# Patient Record
Sex: Male | Born: 1975 | Race: Black or African American | Hispanic: No | Marital: Married | Smoking: Current every day smoker
Health system: Southern US, Community
[De-identification: ages and names within clinical notes are randomized; demographics above are authoritative.]

## PROBLEM LIST (undated history)

## (undated) DIAGNOSIS — S32009A Unspecified fracture of unspecified lumbar vertebra, initial encounter for closed fracture: Secondary | ICD-10-CM

## (undated) DIAGNOSIS — M719 Bursopathy, unspecified: Secondary | ICD-10-CM

## (undated) DIAGNOSIS — I10 Essential (primary) hypertension: Secondary | ICD-10-CM

## (undated) HISTORY — PX: KNEE SURGERY: SHX244

---

## 2012-11-01 ENCOUNTER — Encounter (HOSPITAL_BASED_OUTPATIENT_CLINIC_OR_DEPARTMENT_OTHER): Payer: Self-pay

## 2012-11-01 ENCOUNTER — Emergency Department (HOSPITAL_BASED_OUTPATIENT_CLINIC_OR_DEPARTMENT_OTHER): Payer: 59

## 2012-11-01 ENCOUNTER — Emergency Department (HOSPITAL_BASED_OUTPATIENT_CLINIC_OR_DEPARTMENT_OTHER)
Admission: EM | Admit: 2012-11-01 | Discharge: 2012-11-01 | Disposition: A | Discharge status: Home / Self Care | Payer: 59 | Attending: Emergency Medicine | Admitting: Emergency Medicine

## 2012-11-01 DIAGNOSIS — I10 Essential (primary) hypertension: Secondary | ICD-10-CM | POA: Insufficient documentation

## 2012-11-01 DIAGNOSIS — M67919 Unspecified disorder of synovium and tendon, unspecified shoulder: Secondary | ICD-10-CM | POA: Insufficient documentation

## 2012-11-01 DIAGNOSIS — M7581 Other shoulder lesions, right shoulder: Secondary | ICD-10-CM

## 2012-11-01 DIAGNOSIS — F17200 Nicotine dependence, unspecified, uncomplicated: Secondary | ICD-10-CM | POA: Insufficient documentation

## 2012-11-01 DIAGNOSIS — M719 Bursopathy, unspecified: Secondary | ICD-10-CM | POA: Insufficient documentation

## 2012-11-01 HISTORY — DX: Essential (primary) hypertension: I10

## 2012-11-01 HISTORY — DX: Bursopathy, unspecified: M71.9

## 2012-11-01 MED ORDER — OXYCODONE-ACETAMINOPHEN 5-325 MG PO TABS: 2.0000 | ORAL_TABLET | ORAL | Status: DC | PRN

## 2012-11-01 MED ORDER — PREDNISONE 10 MG PO TABS: 20.0000 mg | ORAL_TABLET | Freq: Two times a day (BID) | ORAL | Status: DC

## 2012-11-01 NOTE — ED Notes (Signed)
Pt states that he was dx with bursitis years ago in the R shoulder.  Recently within the past week he has had severe pain in the R shoulder, incr over the past few days, now unbearable.  Has not seen other MD prior to now.  ROM impaired d/t pain, PMS intact otherwise.

## 2012-11-01 NOTE — ED Provider Notes (Signed)
History  This chart was scribed for William Lyons, MD by Ardelia Mems, ED Scribe. This patient was seen in room [facility] and the patient's care was started at 7:46 PM.   CSN: [id number]  Arrival date & time 11/01/12  1815      Chief Complaint  Patient presents with  . Shoulder Pain     The history is provided by the patient. No language interpreter was used.    HPI Comments: Abbott Jasinski. is a 37 y.o. male who presents to the Emergency Department complaining of constant, moderate right shoulder pain of 1 weeks duration that has worsened in the last 24 hours. Pt states that he had similar episode 5-6 years ago and was diagnosed with bursitis of his right shoulder. Pt states that the pain is exacerbated by lifting his right arm or grabbing anything. Pt denies back pain, abdominal pain, recent injury or falls. 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   Past Medical History  Diagnosis Date  . Bursitis     right  . Hypertension     Past Surgical History  Procedure Laterality Date  . Knee surgery      History reviewed. No pertinent family history.  History  Substance Use Topics  . Smoking status: Current Every Day Smoker    Types: Cigarettes  . Smokeless tobacco: Never Used  . Alcohol Use: No      Review of Systems  Constitutional: Negative for fever and chills.  HENT: Negative for neck pain.   Respiratory: Negative for shortness of breath.   Cardiovascular: Negative for chest pain.  Gastrointestinal: Negative for nausea, vomiting, abdominal pain and diarrhea.  Musculoskeletal: Negative for back pain.  All other systems reviewed and are negative.    Allergies  Lortab and Wheat bran  Home Medications   Current Outpatient Rx  Name  Route  Sig  Dispense  Refill  . ibuprofen (ADVIL,MOTRIN) 200 MG tablet   Oral   Take 400 mg by mouth every 6 (six) hours as needed for pain.           Triage VItals: BP 139/91  Pulse 90  Temp(Src) 97.8 F (36.6 C)  (Oral)  Resp 16  Ht 5\' 11"  (1.803 m)  Wt 300 lb (136.079 kg)  BMI 41.86 kg/m2  SpO2 98%  Physical Exam  Nursing note and vitals reviewed. Constitutional: He is oriented to person, place, and time. He appears well-developed and well-nourished.  HENT:  Head: Normocephalic and atraumatic.  Eyes: EOM are normal. Pupils are equal, round, and reactive to light.  Neck: Normal range of motion. No tracheal deviation present.  Cardiovascular: Normal rate, regular rhythm and normal heart sounds.   No murmur heard. Pulmonary/Chest: Effort normal and breath sounds normal. No respiratory distress.  Abdominal: Soft. There is no tenderness.  Musculoskeletal: He exhibits tenderness. He exhibits no edema.  There is tenderness to palpation over the posterior aspect of the shoulder. There is pain with abduction and external rotation. Distally, the ulnar and radial pulses are easily palpable. Motor and sensory are intact distally.  Neurological: He is alert and oriented to person, place, and time.  Skin: Skin is warm. No rash noted.  Psychiatric: He has a normal mood and affect.    ED Course  Procedures (including critical care time)  DIAGNOSTIC STUDIES: Oxygen Saturation is 98% on RA, normal by my interpretation.    COORDINATION OF CARE: 7:49 PM- Pt advised of plan for treatment and pt agrees.  Labs Reviewed - No data to display Dg Shoulder Right  11/01/2012   *RADIOLOGY REPORT*  Clinical Data: Worsening right shoulder pain and decreased range of motion.  RIGHT SHOULDER - 2+ VIEW  Comparison: None.  Findings: No evidence of fracture or dislocation.  Mild sclerosis of the glenoid is noted with probable mild degenerative spurring. No other significant bone abnormality identified.  IMPRESSION:  1.  No acute findings. 2.  Mild glenohumeral degenerative changes.   Original Report Authenticated By: Myles Rosenthal, M.D.     1. Rotator cuff tendinitis, right       MDM  Seems like rotator cuff  tendinitis.  Will treat with sling, prednisone, pain meds.  Return prn if he worsens.         I personally performed the services described in this documentation, which was scribed in my presence. The recorded information has been reviewed and is accurate.      William Lyons, MD 11/02/12 [phone]

## 2014-09-13 ENCOUNTER — Encounter (HOSPITAL_BASED_OUTPATIENT_CLINIC_OR_DEPARTMENT_OTHER): Payer: Self-pay | Admitting: *Deleted

## 2014-09-13 ENCOUNTER — Emergency Department (HOSPITAL_BASED_OUTPATIENT_CLINIC_OR_DEPARTMENT_OTHER)
Admission: EM | Admit: 2014-09-13 | Discharge: 2014-09-13 | Disposition: A | Discharge status: Home / Self Care | Payer: Worker's Compensation | Attending: Emergency Medicine | Admitting: Emergency Medicine

## 2014-09-13 DIAGNOSIS — I10 Essential (primary) hypertension: Secondary | ICD-10-CM | POA: Insufficient documentation

## 2014-09-13 DIAGNOSIS — W868XXA Exposure to other electric current, initial encounter: Secondary | ICD-10-CM | POA: Diagnosis not present

## 2014-09-13 DIAGNOSIS — Z720 Tobacco use: Secondary | ICD-10-CM | POA: Insufficient documentation

## 2014-09-13 DIAGNOSIS — Y998 Other external cause status: Secondary | ICD-10-CM | POA: Insufficient documentation

## 2014-09-13 DIAGNOSIS — Z7952 Long term (current) use of systemic steroids: Secondary | ICD-10-CM | POA: Insufficient documentation

## 2014-09-13 DIAGNOSIS — Y9389 Activity, other specified: Secondary | ICD-10-CM | POA: Diagnosis not present

## 2014-09-13 DIAGNOSIS — Y9289 Other specified places as the place of occurrence of the external cause: Secondary | ICD-10-CM | POA: Diagnosis not present

## 2014-09-13 DIAGNOSIS — R253 Fasciculation: Secondary | ICD-10-CM

## 2014-09-13 DIAGNOSIS — T754XXA Electrocution, initial encounter: Secondary | ICD-10-CM | POA: Diagnosis not present

## 2014-09-13 DIAGNOSIS — Z8739 Personal history of other diseases of the musculoskeletal system and connective tissue: Secondary | ICD-10-CM | POA: Diagnosis not present

## 2014-09-13 NOTE — Discharge Instructions (Signed)
Electric Shock Injury °Electric shock injuries may be caused by lightning or electricity (current) passing through the body. The amount of injury depends on the current's pressure (voltage), the amount of current (amperage), the type of current (direct vs. alternating), the body's resistance to the current, the current's path through the body, and how long the body remains in contact with the current. Current is the flow of electricity. Electricity may produce effects ranging from barely noticeable tingling to instant death; every part of the body is vulnerable.  °The harshness of injury depends mostly on the voltage. Low voltage can be as dangerous as high voltage under the right circumstances. People have been killed by shocks of just 50 volts. °WHAT DETERMINES THE EFFECTS OF ELECTRICITY? °How electric shocks affect the skin is determined by the skin's resistance. This is the skin's ability to stay unharmed by a shock. This, in turn, depends upon the wetness, dryness, thickness and or cleanliness of the skin. Thin or wet skin is much less resistant than thick or dry skin. When skin resistance is low, the current may cause little or no skin damage but may severely burn internal organs and tissues. Conversely, high skin resistance, such as with dry thick skin, can produce severe skin burns but decreases the current entering the body. °WHAT PARTS OF THE BODY DOES ELECTRICITY AFFECT THE MOST? °· The nervous system (the brain, spinal cord, and nerves) are most helpless to the effects of electricity and most often harmed in electrical injury. Some damage is minor and clears up on its own or with treatment. Sometimes the damage is severe and will be permanent. Neurological problems may be apparent immediately after the accident, or gradually develop over a period of up to three years. °· Damage to the respiratory and cardiovascular systems happens immediately. Electric shocks can paralyze the respiratory system (stop  breathing) or disrupt heart action (cause the heart to beat irregularly or stop). This may cause instant death. Smaller veins and arteries, which get hot more easily than the larger blood vessels, are at greater risk. They can develop blood clots. Damage to the smaller vessels is a common cause of amputation following high-voltage injuries. °· Other injuries may include cataracts, kidney failure, and injury to muscle tissue. An electric arc may set clothing and flammable substances on fire which may cause burns. Strong shocks are often accompanied by violent muscle spasms that can break and dislocate bones. These spasms can also freeze the victim in place and prevent him or her from breaking away from the current. °DIAGNOSIS  °Diagnosis relies on information about the cause of the accident, physical examination, and close monitoring of the heart, lungs, neurological condition and kidney activity. These conditions can change rapidly so close observation is necessary. Magnetic resonance imaging (MRI) may be necessary to check for brain injury. °TREATMENT  °· When an electrical accident happens at home or in the workplace, emergency medical help should be summoned as quickly as possible. The main power should immediately be shut off. If that cannot be done, and current is still flowing through the victim, stand on a dry, non-conducting surface such as a folded newspaper, flattened cardboard carton, or plastic or rubber mat. Use a non-conducting object such as a wooden broomstick (never a damp or metallic object) to push the victim away from the source of the current. Non-conducting means the substance will not pass electricity easily through it. Do not touch the victim or electrical source while the current is still flowing. This   may electrocute the rescuer. °· If the victim is faint, pale, or showing signs of shock, lay the victim down, with the legs elevated above the level of the chest. Warm the person with a  blanket. °· If a pulse can not be felt, or the person is not breathing, someone trained in cardiopulmonary resuscitation (CPR) should begin CPR. Continue this until help arrives. °· If the victim is burned, remove clothing that comes off easily. Rinse the burned area in cool water for pain relief. Give first aid for burns. Burns often require treatment at a burn center. °· Electrical injury can be associated with explosions or falls that can cause other injuries. Avoid moving the head or neck if an injury to this area is suspected. °· Give first aid as needed for other wounds or fractures. °· Fluid replacement therapy is necessary to restore lost fluids and electrolytes. Severely injured tissue is repaired surgically. °· Antibiotics and antibacterial creams are used to prevent infection. °· Kidney failure may need to be treated. °· Physical therapy may help recovery along with counseling if there is disfigurement. °PROGNOSIS  °· Electric shocks may cause death. °· Survivors may require amputation. Cosmetic problems may result along with disfigurement. °· Injuries from household appliances and other low-voltage sources are less likely to produce extreme damage. °PREVENTION  °· Know electrical dangers in your home. °· Damaged electric appliances, wiring, cords, and plugs should be repaired or replaced. Electrical repairs should be attempted only by people with the proper training. °· Hair dryers, radios, and other electric appliances should never be used in the bathroom or anywhere else they might accidentally come in contact with water. Water and pipes create a ground and the electricity picks the easiest way to go to ground which can be through your body. °· Young children need to be kept away from electric appliances and should be taught about the dangers of electricity as soon as they are old enough. °· Electric outlets require safety covers in homes with young children. °· During lightning and thunder storms, go  indoors immediately, even if no rain is falling. Boaters should return to shore as rapidly as possible. °· If the hair on your head or arms stands on end during a storm, seek cover as rapidly as possible as a lightning strike may be about to happen. °· If you cannot reach indoor shelter, stay away from metallic objects such as golf clubs or fishing rods and lie down in low-ground areas. Standing or lying under or next to tall or metallic structures is unsafe. For example, it is unsafe to stand under a tree during a lightning storm. Do not stand next to long conductors of electricity such as wire fences. °· An automobile is appropriate cover, as long as the radio is off. °· Telephones, computers, hair dryers, and other appliances that can act as channels for lightning should not be used during a thunder storm. °· During storms, stay away from screens and metal that may conduct electricity from the outside. °SEEK IMMEDIATE MEDICAL CARE IF: °· You develop chest pain. °· A part of your arms or legs becomes very swollen or painful. °· One of your arms or legs appears pale, cool, or discolored. °· Your urine becomes discolored, or you are not urinating as much as usual. °· You develop severe abdominal pain. °Document Released: 05/24/2003 Document Revised: 08/13/2011 Document Reviewed: 08/17/2013 °Exit[employer]® Patient Information ©2015 [employer]. This information is not intended to replace advice given to you by   your health care provider. Make sure you discuss any questions you have with your health care provider. ° °
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
## 2014-09-13 NOTE — ED Notes (Signed)
Muscle cramping has lessened.
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
## 2015-06-18 ENCOUNTER — Encounter (HOSPITAL_BASED_OUTPATIENT_CLINIC_OR_DEPARTMENT_OTHER): Payer: Self-pay

## 2015-06-18 ENCOUNTER — Emergency Department (HOSPITAL_BASED_OUTPATIENT_CLINIC_OR_DEPARTMENT_OTHER)
Admission: EM | Admit: 2015-06-18 | Discharge: 2015-06-19 | Disposition: A | Discharge status: Home / Self Care | Payer: Commercial Managed Care - HMO | Attending: Emergency Medicine | Admitting: Emergency Medicine

## 2015-06-18 DIAGNOSIS — Z8739 Personal history of other diseases of the musculoskeletal system and connective tissue: Secondary | ICD-10-CM | POA: Insufficient documentation

## 2015-06-18 DIAGNOSIS — R51 Headache: Secondary | ICD-10-CM | POA: Diagnosis not present

## 2015-06-18 DIAGNOSIS — H66001 Acute suppurative otitis media without spontaneous rupture of ear drum, right ear: Secondary | ICD-10-CM

## 2015-06-18 DIAGNOSIS — F17210 Nicotine dependence, cigarettes, uncomplicated: Secondary | ICD-10-CM | POA: Diagnosis not present

## 2015-06-18 DIAGNOSIS — H9201 Otalgia, right ear: Secondary | ICD-10-CM | POA: Diagnosis present

## 2015-06-18 DIAGNOSIS — R0981 Nasal congestion: Secondary | ICD-10-CM | POA: Diagnosis not present

## 2015-06-18 DIAGNOSIS — K029 Dental caries, unspecified: Secondary | ICD-10-CM

## 2015-06-18 DIAGNOSIS — I10 Essential (primary) hypertension: Secondary | ICD-10-CM | POA: Diagnosis not present

## 2015-06-18 DIAGNOSIS — Z7952 Long term (current) use of systemic steroids: Secondary | ICD-10-CM | POA: Insufficient documentation

## 2015-06-18 MED ORDER — AMOXICILLIN 500 MG PO CAPS
500.0000 mg | ORAL_CAPSULE | Freq: Once | ORAL | Status: AC
  Administered 2015-06-18: 500 mg via ORAL
  Filled 2015-06-18: qty 1

## 2015-06-18 MED ORDER — IBUPROFEN 800 MG PO TABS
800.0000 mg | ORAL_TABLET | Freq: Once | ORAL | Status: AC
  Administered 2015-06-18: 800 mg via ORAL
  Filled 2015-06-18: qty 1

## 2015-06-18 NOTE — ED Notes (Addendum)
Pt with one day history of right ear pain, hard of hearing in bilateral ears. Reports sensation of fullness. Associated headache. Had Alka seltzer and Motrin prior to arrival.

## 2015-06-18 NOTE — ED Provider Notes (Signed)
By signing my name below, I, William Spears, attest that this documentation has been prepared under the direction and in the presence of William Watson N Kendon Sedeno, DO. Electronically Signed: Bethel BornBritney Spears, ED Scribe. 06/18/2015. 11:38 PM.  TIME SEEN: 11:34 PM  CHIEF COMPLAINT: right ear pain  HPI: William ArmsCurtis Benard Jr. is a 40 y.o. male who presents to the Emergency Department complaining of constant, 6/10 in severity, aching,  right ear pain with onset 8 hours ago. Alka selter near 6 PM provided no relief at home. Associated symptoms include headache and nasal congestion. Pt denies fever, sore throat, dental pain, and cough.   ROS: See HPI Constitutional: no fever  Eyes: no drainage  ENT: no runny nose   Cardiovascular:  no chest pain  Resp: no SOB  GI: no vomiting GU: no dysuria Integumentary: no rash  Allergy: no hives  Musculoskeletal: no leg swelling  Neurological: no slurred speech ROS otherwise negative  PAST MEDICAL HISTORY/PAST SURGICAL HISTORY:  Past Medical History  Diagnosis Date  . Bursitis     right  . Hypertension     MEDICATIONS:  Prior to Admission medications   Medication Sig Start Date End Date Taking? Authorizing Provider  ibuprofen (ADVIL,MOTRIN) 200 MG tablet Take 400 mg by mouth every 6 (six) hours as needed for pain.    Historical Provider, MD  nabumetone (RELAFEN) 500 MG tablet Take 500 mg by mouth 2 (two) times daily as needed.    Historical Provider, MD  oxyCODONE-acetaminophen (PERCOCET) 5-325 MG per tablet Take 2 tablets by mouth every 4 (four) hours as needed for pain. 11/01/12   Geoffery Lyonsouglas Delo, MD  predniSONE (DELTASONE) 10 MG tablet Take 2 tablets (20 mg total) by mouth 2 (two) times daily. 11/01/12   Geoffery Lyonsouglas Delo, MD    ALLERGIES:  Allergies  Allergen Reactions  . Lortab [Hydrocodone-Acetaminophen]   . Wheat Bran     SOCIAL HISTORY:  Social History  Substance Use Topics  . Smoking status: Current Every Day Smoker    Types: Cigarettes  .  Smokeless tobacco: Never Used  . Alcohol Use: No    FAMILY HISTORY: History reviewed. No pertinent family history.  EXAM: BP 147/99 mmHg  Pulse 80  Temp(Src) 98.2 F (36.8 C) (Oral)  Resp 18  Ht 5\' 11"  (1.803 m)  Wt 290 lb (131.543 kg)  BMI 40.46 kg/m2  SpO2 100% CONSTITUTIONAL: Alert and oriented and responds appropriately to questions. Well-appearing; well-nourished HEAD: Normocephalic EYES: Conjunctivae clear, PERRL ENT: normal nose; no rhinorrhea; moist mucous membranes; pharynx without lesions noted, no tonsillar hypertrophy or exudate, no uvular deviation, no trismus or drooling, normal phonation, no stridor, pt has multiple dental caries and some gingival irritation around third right posterior molar, no dental abscess noted, no Ludwig's angina, tongue sits flat in the bottom of the mouth; pt's R TM is erythematous, bulging with associated purulence, no perforation; L TM appears normal, no cerumen impaction, no signs of mastoiditis NECK: Supple, no meningismus, no LAD  CARD: RRR; S1 and S2 appreciated; no murmurs, no clicks, no rubs, no gallops RESP: Normal chest excursion without splinting or tachypnea; breath sounds clear and equal bilaterally; no wheezes, no rhonchi, no rales, no hypoxia or respiratory distress, speaking full sentences ABD/GI: Normal bowel sounds; non-distended; soft, non-tender, no rebound, no guarding, no peritoneal signs BACK:  The back appears normal and is non-tender to palpation, there is no CVA tenderness EXT: Normal ROM in all joints; non-tender to palpation; no edema; normal capillary refill; no cyanosis, no  calf tenderness or swelling    SKIN: Normal color for age and race; warm NEURO: Moves all extremities equally, sensation to light touch intact diffusely, cranial nerves II through XII intact PSYCH: The patient's mood and manner are appropriate. Grooming and personal hygiene are appropriate.  MEDICAL DECISION MAKING: Patient here with right otitis  media and dental caries. We'll place him on amoxicillin which would treat both potential dental infection and otitis media. No sign of Ludwig's angina, facial cellulitis, mastoiditis on exam. He appears to have normal hearing on exam. No meningismus. Nontoxic. Afebrile. We'll discharge with instructions to alternate Tylenol and ibuprofen. Have advised him to follow-up with his dentist. Discussed return precautions. He verbalized understanding and is comfortable with this plan.    I personally performed the services described in this documentation, which was scribed in my presence. The recorded information has been reviewed and is accurate.      William Maw Trevonte Ashkar, DO 06/19/15 0004

## 2015-06-19 MED ORDER — AMOXICILLIN 500 MG PO CAPS: 500.0000 mg | ORAL_CAPSULE | Freq: Three times a day (TID) | ORAL | Status: DC

## 2015-06-19 NOTE — Discharge Instructions (Signed)
You may take Tylenol 1000 mg every 6 hours as needed for fever and pain and alternate this with ibuprofen 800 mg every 8 hours as needed for fever and pain.   Dental Caries Dental caries (also called tooth decay) is the most common oral disease. It can occur at any age but is more common in children and young adults.  HOW DENTAL CARIES DEVELOPS  The process of decay begins when bacteria and foods (particularly sugars and starches) combine in your mouth to produce plaque. Plaque is a substance that sticks to the hard, outer surface of a tooth (enamel). The bacteria in plaque produce acids that attack enamel. These acids may also attack the root surface of a tooth (cementum) if it is exposed. Repeated attacks dissolve these surfaces and create holes in the tooth (cavities). If left untreated, the acids destroy the other layers of the tooth.  RISK FACTORS  Frequent sipping of sugary beverages.   Frequent snacking on sugary and starchy foods, especially those that easily get stuck in the teeth.   Poor oral hygiene.   Dry mouth.   Substance abuse such as methamphetamine abuse.   Broken or poor-fitting dental restorations.   Eating disorders.   Gastroesophageal reflux disease (GERD).   Certain radiation treatments to the head and neck. SYMPTOMS In the early stages of dental caries, symptoms are seldom present. Sometimes white, chalky areas may be seen on the enamel or other tooth layers. In later stages, symptoms may include:  Pits and holes on the enamel.  Toothache after sweet, hot, or cold foods or drinks are consumed.  Pain around the tooth.  Swelling around the tooth. DIAGNOSIS  Most of the time, dental caries is detected during a regular dental checkup. A diagnosis is made after a thorough medical and dental history is taken and the surfaces of your teeth are checked for signs of dental caries. Sometimes special instruments, such as lasers, are used to check for dental  caries. Dental X-ray exams may be taken so that areas not visible to the eye (such as between the contact areas of the teeth) can be checked for cavities.  TREATMENT  If dental caries is in its early stages, it may be reversed with a fluoride treatment or an application of a remineralizing agent at the dental office. Thorough brushing and flossing at home is needed to aid these treatments. If it is in its later stages, treatment depends on the location and extent of tooth destruction:   If a small area of the tooth has been destroyed, the destroyed area will be removed and cavities will be filled with a material such as gold, silver amalgam, or composite resin.   If a large area of the tooth has been destroyed, the destroyed area will be removed and a cap (crown) will be fitted over the remaining tooth structure.   If the center part of the tooth (pulp) is affected, a procedure called a root canal will be needed before a filling or crown can be placed.   If most of the tooth has been destroyed, the tooth may need to be pulled (extracted). HOME CARE INSTRUCTIONS You can prevent, stop, or reverse dental caries at home by practicing good oral hygiene. Good oral hygiene includes:  Thoroughly cleaning your teeth at least twice a day with a toothbrush and dental floss.   Using a fluoride toothpaste. A fluoride mouth rinse may also be used if recommended by your dentist or health care provider.  Restricting the amount of sugary and starchy foods and sugary liquids you consume.   Avoiding frequent snacking on these foods and sipping of these liquids.   Keeping regular visits with a dentist for checkups and cleanings. PREVENTION   Practice good oral hygiene.  Consider a dental sealant. A dental sealant is a coating material that is applied by your dentist to the pits and grooves of teeth. The sealant prevents food from being trapped in them. It may protect the teeth for several  years.  Ask about fluoride supplements if you live in a community without fluorinated water or with water that has a low fluoride content. Use fluoride supplements as directed by your dentist or health care provider.  Allow fluoride varnish applications to teeth if directed by your dentist or health care provider.   This information is not intended to replace advice given to you by your health care provider. Make sure you discuss any questions you have with your health care provider.   Document Released: 02/10/2002 Document Revised: 06/11/2014 Document Reviewed: 05/23/2012 Elsevier Interactive Patient Education 2016 Elsevier Inc.  Otitis Media, Adult Otitis media is redness, soreness, and inflammation of the middle ear. Otitis media may be caused by allergies or, most commonly, by infection. Often it occurs as a complication of the common cold. SIGNS AND SYMPTOMS Symptoms of otitis media may include:  Earache.  Fever.  Ringing in your ear.  Headache.  Leakage of fluid from the ear. DIAGNOSIS To diagnose otitis media, your health care provider will examine your ear with an otoscope. This is an instrument that allows your health care provider to see into your ear in order to examine your eardrum. Your health care provider also will ask you questions about your symptoms. TREATMENT  Typically, otitis media resolves on its own within 3-5 days. Your health care provider may prescribe medicine to ease your symptoms of pain. If otitis media does not resolve within 5 days or is recurrent, your health care provider may prescribe antibiotic medicines if he or she suspects that a bacterial infection is the cause. HOME CARE INSTRUCTIONS   If you were prescribed an antibiotic medicine, finish it all even if you start to feel better.  Take medicines only as directed by your health care provider.  Keep all follow-up visits as directed by your health care provider. SEEK MEDICAL CARE IF:  You  have otitis media only in one ear, or bleeding from your nose, or both.  You notice a lump on your neck.  You are not getting better in 3-5 days.  You feel worse instead of better. SEEK IMMEDIATE MEDICAL CARE IF:   You have pain that is not controlled with medicine.  You have swelling, redness, or pain around your ear or stiffness in your neck.  You notice that part of your face is paralyzed.  You notice that the bone behind your ear (mastoid) is tender when you touch it. MAKE SURE YOU:   Understand these instructions.  Will watch your condition.  Will get help right away if you are not doing well or get worse.   This information is not intended to replace advice given to you by your health care provider. Make sure you discuss any questions you have with your health care provider.   Document Released: 02/24/2004 Document Revised: 06/11/2014 Document Reviewed: 12/16/2012 Elsevier Interactive Patient Education [employer]2016 Elsevier Inc.

## 2016-03-11 ENCOUNTER — Encounter (HOSPITAL_BASED_OUTPATIENT_CLINIC_OR_DEPARTMENT_OTHER): Payer: Self-pay | Admitting: Emergency Medicine

## 2016-03-11 ENCOUNTER — Emergency Department (HOSPITAL_BASED_OUTPATIENT_CLINIC_OR_DEPARTMENT_OTHER)
Admission: EM | Admit: 2016-03-11 | Discharge: 2016-03-11 | Disposition: A | Discharge status: Home / Self Care | Payer: Commercial Managed Care - HMO | Attending: Emergency Medicine | Admitting: Emergency Medicine

## 2016-03-11 ENCOUNTER — Emergency Department (HOSPITAL_BASED_OUTPATIENT_CLINIC_OR_DEPARTMENT_OTHER): Payer: Commercial Managed Care - HMO

## 2016-03-11 DIAGNOSIS — F17210 Nicotine dependence, cigarettes, uncomplicated: Secondary | ICD-10-CM | POA: Insufficient documentation

## 2016-03-11 DIAGNOSIS — J209 Acute bronchitis, unspecified: Secondary | ICD-10-CM | POA: Diagnosis not present

## 2016-03-11 DIAGNOSIS — Z79899 Other long term (current) drug therapy: Secondary | ICD-10-CM | POA: Insufficient documentation

## 2016-03-11 DIAGNOSIS — R05 Cough: Secondary | ICD-10-CM | POA: Diagnosis present

## 2016-03-11 DIAGNOSIS — I10 Essential (primary) hypertension: Secondary | ICD-10-CM | POA: Insufficient documentation

## 2016-03-11 MED ORDER — IPRATROPIUM BROMIDE 0.02 % IN SOLN
0.5000 mg | Freq: Once | RESPIRATORY_TRACT | Status: AC
  Administered 2016-03-11: 0.5 mg via RESPIRATORY_TRACT
  Filled 2016-03-11: qty 2.5

## 2016-03-11 MED ORDER — PREDNISONE 20 MG PO TABS: 40.0000 mg | ORAL_TABLET | Freq: Every day | ORAL | 0 refills | Status: DC

## 2016-03-11 MED ORDER — ALBUTEROL SULFATE (2.5 MG/3ML) 0.083% IN NEBU
5.0000 mg | INHALATION_SOLUTION | Freq: Once | RESPIRATORY_TRACT | Status: AC
  Administered 2016-03-11: 5 mg via RESPIRATORY_TRACT
  Filled 2016-03-11: qty 6

## 2016-03-11 MED ORDER — PROMETHAZINE-DM 6.25-15 MG/5ML PO SYRP: 5.0000 mL | ORAL_SOLUTION | Freq: Four times a day (QID) | ORAL | 0 refills | Status: DC | PRN

## 2016-03-11 MED ORDER — PREDNISONE 50 MG PO TABS
60.0000 mg | ORAL_TABLET | Freq: Once | ORAL | Status: AC
  Administered 2016-03-11: 60 mg via ORAL
  Filled 2016-03-11: qty 1

## 2016-03-11 MED ORDER — ALBUTEROL SULFATE HFA 108 (90 BASE) MCG/ACT IN AERS
1.0000 | INHALATION_SPRAY | RESPIRATORY_TRACT | Status: DC | PRN
  Administered 2016-03-11: 2 via RESPIRATORY_TRACT
  Filled 2016-03-11: qty 6.7

## 2016-03-11 NOTE — ED Provider Notes (Addendum)
[facility] Provider Note   CSN: [id number] Arrival date & time: 03/11/16  2841   By signing my name below, I, Teofilo Pod, attest that this documentation has been prepared under the direction and in the presence of Gwyneth Sprout, MD . Electronically Signed: Teofilo Pod, ED Scribe. 03/11/2016. 8:21 PM.   History   Chief Complaint Chief Complaint  Patient presents with  . Cough    The history is provided by the patient. No language interpreter was used.   HPI Comments:  William Spears. is a 40 y.o. male with PMHx of HTN who presents to the Emergency Department complaining of constant productive cough x 1 week. Pt complains of associated chest tightness, rhinorrhea, difficulty breathing, wheezing, and states that he had blood in his sputum once. Pt is a smoker. Pt notes possible sick contact, works in [employer]. No alleviating factors noted. Pt denies fever.   Past Medical History:  Diagnosis Date  . Bursitis    right  . Hypertension     There are no active problems to display for this patient.   Past Surgical History:  Procedure Laterality Date  . KNEE SURGERY         Home Medications    Prior to Admission medications   Medication Sig Start Date End Date Taking? Authorizing Provider  amoxicillin (AMOXIL) 500 MG capsule Take 1 capsule (500 mg total) by mouth 3 (three) times daily. 06/19/15   Layla Maw Ward, DO    Family History History reviewed. No pertinent family history.  Social History Social History  Substance Use Topics  . Smoking status: Current Every Day Smoker    Types: Cigarettes  . Smokeless tobacco: Never Used  . Alcohol use No     Allergies   Lortab [hydrocodone-acetaminophen] and Wheat bran   Review of Systems Review of Systems  Constitutional: Negative for fever.  HENT: Positive for rhinorrhea.   Respiratory: Positive for cough, chest tightness, shortness of breath and wheezing.   All other systems  reviewed and are negative.    Physical Exam Updated Vital Signs BP 133/97   Pulse 100   Temp 97.9 F (36.6 C)   Resp 20   Ht 5\' 11"  (1.803 m)   Wt 290 lb (131.5 kg)   SpO2 98%   BMI 40.45 kg/m   Physical Exam  Constitutional: He appears well-developed and well-nourished.  HENT:  Head: Normocephalic and atraumatic.  Nose: Mucosal edema present.  Eyes: Conjunctivae are normal.  Neck: Neck supple.  Cardiovascular: Normal rate, regular rhythm and normal heart sounds.   No murmur heard. Pulmonary/Chest: Effort normal and breath sounds normal. No respiratory distress.  Scant wheezing and globally decreased breath sounds.   Abdominal: Soft. There is no tenderness.  Musculoskeletal: He exhibits no edema.  Neurological: He is alert.  Skin: Skin is warm and dry.  Psychiatric: He has a normal mood and affect.  Nursing note and vitals reviewed.    ED Treatments / Results  DIAGNOSTIC STUDIES:  Oxygen Saturation is 98% on RA, normal by my interpretation.    COORDINATION OF CARE:  8:21 PM Discussed treatment plan with pt at bedside and pt agreed to plan.   Labs (all labs ordered are listed, but only abnormal results are displayed) Labs Reviewed - No data to display  EKG  EKG Interpretation None       Radiology Dg Chest 2 View  Result Date: 03/11/2016 CLINICAL DATA:  Dry cough for several days.  Chest soreness. EXAM: CHEST  2 VIEW COMPARISON:  None. FINDINGS: The cardiomediastinal silhouette is within normal limits. The lungs are clear. There is no evidence of pleural effusion or pneumothorax. No acute osseous abnormality is identified. IMPRESSION: No active cardiopulmonary disease. Electronically Signed   By: Sebastian AcheAllen  Grady M.D.   On: 03/11/2016 20:14    Procedures Procedures (including critical care time)  Medications Ordered in ED Medications - No data to display   Initial Impression / Assessment and Plan / ED Course  I have reviewed the triage vital signs and  the nursing notes.  Pertinent labs & imaging results that were available during my care of the patient were reviewed by me and considered in my medical decision making (see chart for details).  Clinical Course   Pt with symptoms consistent with viral URI/bronchitis.  Well appearing here but scant wheezing and globally decreased breath sounds.  No signs of pharyngitis, otitis or abnormal abdominal findings.   CXR wnl.  Pt given albuterol/atrovent and prednisone.  Will recheck.  9:16 PM Improved breath sounds after neb.  Will d/c home with albuterol and prednisone.  Final Clinical Impressions(s) / ED Diagnoses   Final diagnoses:  Acute bronchitis, unspecified organism    New Prescriptions New Prescriptions   PREDNISONE (DELTASONE) 20 MG TABLET    Take 2 tablets (40 mg total) by mouth daily.   PROMETHAZINE-DEXTROMETHORPHAN (PROMETHAZINE-DM) 6.25-15 MG/5ML SYRUP    Take 5 mLs by mouth 4 (four) times daily as needed for cough.   I personally performed the services described in this documentation, which was scribed in my presence.  The recorded information has been reviewed and considered.     Gwyneth SproutWhitney Dewitt Judice, MD 03/11/16 2116    Gwyneth SproutWhitney Dannetta Lekas, MD 03/11/16 2150

## 2016-03-11 NOTE — ED Triage Notes (Signed)
Pt in c/o dry cough x several days, making his chest sore. States was coughing today and noted mild blood coming up. Pt is alert, interactive, ambulatory in NAD.

## 2016-05-01 ENCOUNTER — Emergency Department (HOSPITAL_BASED_OUTPATIENT_CLINIC_OR_DEPARTMENT_OTHER)
Admission: EM | Admit: 2016-05-01 | Discharge: 2016-05-01 | Disposition: A | Discharge status: Home / Self Care | Payer: Commercial Managed Care - HMO | Attending: Emergency Medicine | Admitting: Emergency Medicine

## 2016-05-01 ENCOUNTER — Encounter (HOSPITAL_BASED_OUTPATIENT_CLINIC_OR_DEPARTMENT_OTHER): Payer: Self-pay

## 2016-05-01 DIAGNOSIS — I10 Essential (primary) hypertension: Secondary | ICD-10-CM | POA: Diagnosis not present

## 2016-05-01 DIAGNOSIS — K029 Dental caries, unspecified: Secondary | ICD-10-CM | POA: Diagnosis not present

## 2016-05-01 DIAGNOSIS — H6501 Acute serous otitis media, right ear: Secondary | ICD-10-CM

## 2016-05-01 DIAGNOSIS — F17210 Nicotine dependence, cigarettes, uncomplicated: Secondary | ICD-10-CM | POA: Insufficient documentation

## 2016-05-01 DIAGNOSIS — H9201 Otalgia, right ear: Secondary | ICD-10-CM | POA: Diagnosis present

## 2016-05-01 MED ORDER — TRAMADOL HCL 50 MG PO TABS
50.0000 mg | ORAL_TABLET | Freq: Once | ORAL | Status: AC
  Administered 2016-05-01: 50 mg via ORAL
  Filled 2016-05-01: qty 1

## 2016-05-01 MED ORDER — AMOXICILLIN 500 MG PO CAPS: 500.0000 mg | ORAL_CAPSULE | Freq: Three times a day (TID) | ORAL | 0 refills | Status: DC

## 2016-05-01 MED ORDER — IBUPROFEN 800 MG PO TABS: 800.0000 mg | ORAL_TABLET | Freq: Three times a day (TID) | ORAL | 0 refills | Status: DC | PRN

## 2016-05-01 MED ORDER — AMOXICILLIN 500 MG PO CAPS
500.0000 mg | ORAL_CAPSULE | Freq: Once | ORAL | Status: AC
  Administered 2016-05-01: 500 mg via ORAL
  Filled 2016-05-01: qty 1

## 2016-05-01 MED ORDER — TRAMADOL HCL 50 MG PO TABS: 50.0000 mg | ORAL_TABLET | Freq: Four times a day (QID) | ORAL | 0 refills | Status: DC | PRN

## 2016-05-01 NOTE — Discharge Instructions (Signed)
Please follow up with your dentist.  You may alternate between Tylenol 1000 mg every 6 hours as needed for fever and pain and ibuprofen 800 mg every 8 hours as needed for fever and pain.   To find a primary care or specialty doctor please call [phone] or [phone] to access "[facility] Find a Doctor Service."  You may also go on the [facility] website at [email or web address]  There are also multiple [facility], [facility], [facility] and [facility] practices throughout [place] that are frequently accepting new patients. You may find a clinic that is close to your home and contact them.  [facility] -  [address] [place] [phone]   [facility] -  [address]  [place] [id number] [phone]   [facility] [phone]  [place] [phone]

## 2016-05-01 NOTE — ED Triage Notes (Signed)
Pt c/o rt facial/jaw pain radiating to rt ear, states unable to sleep or lay on that side. States went to the dentist 2wks ago and placed on antibotics.

## 2016-05-01 NOTE — ED Provider Notes (Signed)
TIME SEEN: 2:45 AM  CHIEF COMPLAINT: Right-sided ear pain  HPI: Pt is a 40 y.o. male with history of hypertension who presents emergency department with several days of right-sided ear pain radiating now into his right jaw. No fevers, cough, vomiting, diarrhea. Was recently seen by a dentist 2 weeks ago and put on antibiotics which he finished one week ago. He is not having dental pain, facial swelling, erythema or warmth. No difficulty swallowing, speaking or breathing.  ROS: See HPI Constitutional: no fever  Eyes: no drainage  ENT: no runny nose   Cardiovascular:  no chest pain  Resp: no SOB  GI: no vomiting GU: no dysuria Integumentary: no rash  Allergy: no hives  Musculoskeletal: no leg swelling  Neurological: no slurred speech ROS otherwise negative  PAST MEDICAL HISTORY/PAST SURGICAL HISTORY:  Past Medical History:  Diagnosis Date  . Bursitis    right  . Hypertension     MEDICATIONS:  Prior to Admission medications   Medication Sig Start Date End Date Taking? Authorizing Provider  amoxicillin (AMOXIL) 500 MG capsule Take 1 capsule (500 mg total) by mouth 3 (three) times daily. 06/19/15   Sheila Gervasi N Clenton Esper, DO  predniSONE (DELTASONE) 20 MG tablet Take 2 tablets (40 mg total) by mouth daily. 03/11/16   Gwyneth SproutWhitney Plunkett, MD  promethazine-dextromethorphan (PROMETHAZINE-DM) 6.25-15 MG/5ML syrup Take 5 mLs by mouth 4 (four) times daily as needed for cough. 03/11/16   Gwyneth SproutWhitney Plunkett, MD    ALLERGIES:  Allergies  Allergen Reactions  . Lortab [Hydrocodone-Acetaminophen]   . Wheat Bran     SOCIAL HISTORY:  Social History  Substance Use Topics  . Smoking status: Current Every Day Smoker    Types: Cigarettes  . Smokeless tobacco: Never Used  . Alcohol use No    FAMILY HISTORY: No family history on file.  EXAM: BP 143/91 (BP Location: Right Arm)   Pulse 73   Temp 97.9 F (36.6 C) (Oral)   Resp 18   Ht 5\' 11"  (1.803 m)   Wt 290 lb (131.5 kg)   SpO2 100%   BMI 40.45  kg/m  CONSTITUTIONAL: Alert and oriented and responds appropriately to questions. Well-appearing; well-nourished HEAD: Normocephalic EYES: Conjunctivae clear, PERRL, EOMI ENT: normal nose; no rhinorrhea; moist mucous membranes; right TM is erythematous with bulging and effusion. No perforation.  Left TM is clear without erythema, purulence, bulging, perforation, effusion.  No cerumen impaction or sign of foreign body in the external auditory canal bilaterally. No inflammation, erythema or drainage from the external auditory canal. No signs of mastoiditis. No pain with manipulation of the pinna bilaterally. No pharyngeal erythema or petechiae, no tonsillar hypertrophy or exudate, no uvular deviation, no unilateral swelling, no trismus or drooling, no muffled voice, normal phonation, no stridor, multiple dental caries present, right lower third molar is decayed significantly but nontender, no drainable dental abscess noted, no Ludwig's angina, tongue sits flat in the bottom of the mouth, no angioedema, no facial erythema or warmth, no facial swelling; no pain with movement of the neck NECK: Supple, no meningismus, no nuchal rigidity, no LAD  CARD: RRR; S1 and S2 appreciated; no murmurs, no clicks, no rubs, no gallops RESP: Normal chest excursion without splinting or tachypnea; breath sounds clear and equal bilaterally; no wheezes, no rhonchi, no rales, no hypoxia or respiratory distress, speaking full sentences ABD/GI: Normal bowel sounds; non-distended; soft, non-tender, no rebound, no guarding, no peritoneal signs, no hepatosplenomegaly BACK:  The back appears normal and is non-tender to palpation,  there is no CVA tenderness EXT: Normal ROM in all joints; non-tender to palpation; no edema; normal capillary refill; no cyanosis, no calf tenderness or swelling    SKIN: Normal color for age and race; warm; no rash NEURO: Moves all extremities equally, sensation to light touch intact diffusely, cranial  nerves II through XII intact, normal speech PSYCH: The patient's mood and manner are appropriate. Grooming and personal hygiene are appropriate.  MEDICAL DECISION MAKING: Patient here with right-sided otitis media. Also has a right lower dental decay of the third molar but no sign of drainable abscess. No Ludwig's angina. No facial cellulitis. Will discharge on amoxicillin and was short course of tramadol for pain control. States he's been doing Tylenol and ibuprofen at home without any relief. He has a Education officer, communitydentist for follow-up. Discussed with him return precautions. He verbalizes understanding and is comfortable with this plan.  At this time, I do not feel there is any life-threatening condition present. I have reviewed and discussed all results (EKG, imaging, lab, urine as appropriate) and exam findings with patient/family. I have reviewed nursing notes and appropriate previous records.  I feel the patient is safe to be discharged home without further emergent workup and can continue workup as an outpatient as needed. Discussed usual and customary return precautions. Patient/family verbalize understanding and are comfortable with this plan.  Outpatient follow-up has been provided. All questions have been answered.      Layla MawKristen N Shawnika Pepin, DO 05/01/16 [phone]

## 2016-06-29 ENCOUNTER — Emergency Department (HOSPITAL_BASED_OUTPATIENT_CLINIC_OR_DEPARTMENT_OTHER)
Admission: EM | Admit: 2016-06-29 | Discharge: 2016-06-29 | Disposition: A | Discharge status: Home / Self Care | Payer: Commercial Managed Care - HMO | Attending: Emergency Medicine | Admitting: Emergency Medicine

## 2016-06-29 ENCOUNTER — Encounter (HOSPITAL_BASED_OUTPATIENT_CLINIC_OR_DEPARTMENT_OTHER): Payer: Self-pay | Admitting: Emergency Medicine

## 2016-06-29 DIAGNOSIS — Y929 Unspecified place or not applicable: Secondary | ICD-10-CM | POA: Insufficient documentation

## 2016-06-29 DIAGNOSIS — I10 Essential (primary) hypertension: Secondary | ICD-10-CM | POA: Insufficient documentation

## 2016-06-29 DIAGNOSIS — X501XXA Overexertion from prolonged static or awkward postures, initial encounter: Secondary | ICD-10-CM | POA: Diagnosis not present

## 2016-06-29 DIAGNOSIS — Y999 Unspecified external cause status: Secondary | ICD-10-CM | POA: Insufficient documentation

## 2016-06-29 DIAGNOSIS — F17210 Nicotine dependence, cigarettes, uncomplicated: Secondary | ICD-10-CM | POA: Insufficient documentation

## 2016-06-29 DIAGNOSIS — Y9389 Activity, other specified: Secondary | ICD-10-CM | POA: Insufficient documentation

## 2016-06-29 DIAGNOSIS — S3992XA Unspecified injury of lower back, initial encounter: Secondary | ICD-10-CM | POA: Diagnosis present

## 2016-06-29 DIAGNOSIS — S39012A Strain of muscle, fascia and tendon of lower back, initial encounter: Secondary | ICD-10-CM | POA: Insufficient documentation

## 2016-06-29 HISTORY — DX: Unspecified fracture of unspecified lumbar vertebra, initial encounter for closed fracture: S32.009A

## 2016-06-29 LAB — URINALYSIS, ROUTINE W REFLEX MICROSCOPIC
Bilirubin Urine: NEGATIVE
GLUCOSE, UA: NEGATIVE mg/dL
HGB URINE DIPSTICK: NEGATIVE
Ketones, ur: NEGATIVE mg/dL
Leukocytes, UA: NEGATIVE
Nitrite: NEGATIVE
Protein, ur: NEGATIVE mg/dL
SPECIFIC GRAVITY, URINE: 1.023 (ref 1.005–1.030)
pH: 6.5 (ref 5.0–8.0)

## 2016-06-29 MED ORDER — PREDNISONE 10 MG (21) PO TBPK: ORAL_TABLET | ORAL | 0 refills | Status: DC

## 2016-06-29 MED ORDER — KETOROLAC TROMETHAMINE 30 MG/ML IJ SOLN
30.0000 mg | Freq: Once | INTRAMUSCULAR | Status: AC
  Administered 2016-06-29: 30 mg via INTRAMUSCULAR
  Filled 2016-06-29: qty 1

## 2016-06-29 MED ORDER — DIAZEPAM 5 MG PO TABS
5.0000 mg | ORAL_TABLET | Freq: Once | ORAL | Status: AC
  Administered 2016-06-29: 5 mg via ORAL
  Filled 2016-06-29: qty 1

## 2016-06-29 MED ORDER — OXYCODONE-ACETAMINOPHEN 5-325 MG PO TABS: 1.0000 | ORAL_TABLET | ORAL | 0 refills | Status: DC | PRN

## 2016-06-29 MED ORDER — OXYCODONE-ACETAMINOPHEN 5-325 MG PO TABS
1.0000 | ORAL_TABLET | Freq: Once | ORAL | Status: AC
  Administered 2016-06-29: 1 via ORAL
  Filled 2016-06-29: qty 1

## 2016-06-29 MED ORDER — IBUPROFEN 800 MG PO TABS: 800.0000 mg | ORAL_TABLET | Freq: Three times a day (TID) | ORAL | 0 refills | Status: AC | PRN

## 2016-06-29 MED ORDER — PREDNISONE 50 MG PO TABS
60.0000 mg | ORAL_TABLET | Freq: Once | ORAL | Status: AC
  Administered 2016-06-29: 60 mg via ORAL
  Filled 2016-06-29: qty 1

## 2016-06-29 MED ORDER — MORPHINE SULFATE (PF) 4 MG/ML IV SOLN
4.0000 mg | Freq: Once | INTRAVENOUS | Status: AC
  Administered 2016-06-29: 4 mg via INTRAMUSCULAR
  Filled 2016-06-29: qty 1

## 2016-06-29 MED ORDER — DIAZEPAM 5 MG PO TABS: 5.0000 mg | ORAL_TABLET | Freq: Two times a day (BID) | ORAL | 0 refills | Status: DC

## 2016-06-29 MED FILL — diazePAM 5 MG TABS: 5 | 5 days supply | Qty: 10 | Fill #0

## 2016-06-29 MED FILL — predniSONE 10 MG TABS: 10 | 12 days supply | Qty: 42 | Fill #0

## 2016-06-29 MED FILL — OXYCODONE W/APAP 5/325 TAB: 5-325 | 2 days supply | Qty: 10 | Fill #0

## 2016-06-29 MED FILL — IBUPROFEN 800 MG TABLET: 800 | 10 days supply | Qty: 30 | Fill #0

## 2016-06-29 NOTE — ED Notes (Signed)
Pt unable to give UA at this time 

## 2016-06-29 NOTE — ED Triage Notes (Addendum)
Pt reports R side low back pain, was hurting when he woke up this morning. Pt states it is non radiating. Pt denies numbness or tingling to extremities, denies difficulty urinating. Denies injury, reports hx of lumbar fx as a teenager.

## 2016-06-29 NOTE — ED Provider Notes (Signed)
[facility] Provider Note   CSN: [id number] Arrival date & time: 06/29/16  1025     History   Chief Complaint Chief Complaint  Patient presents with  . Back Pain    HPI William Spears. is a 41 y.o. male.  Pt presents to the ED with back pain.  The pt said that he woke up with low back pain.  The pt said he took ibuprofen, went to work, twisted, and the pain is now severe.  The pt said it hurts to move.  He denies any other sx.      Past Medical History:  Diagnosis Date  . Bursitis    right  . Hypertension   . Lumbar verterbral fracture, traumatic (HCC)     There are no active problems to display for this patient.   Past Surgical History:  Procedure Laterality Date  . KNEE SURGERY         Home Medications    Prior to Admission medications   Medication Sig Start Date End Date Taking? Authorizing Provider  amoxicillin (AMOXIL) 500 MG capsule Take 1 capsule (500 mg total) by mouth 3 (three) times daily. 05/01/16   Kristen N Ward, DO  diazepam (VALIUM) 5 MG tablet Take 1 tablet (5 mg total) by mouth 2 (two) times daily. 06/29/16   Jacalyn Lefevre, MD  ibuprofen (ADVIL,MOTRIN) 800 MG tablet Take 1 tablet (800 mg total) by mouth every 8 (eight) hours as needed for mild pain. 06/29/16   Jacalyn Lefevre, MD  oxyCODONE-acetaminophen (PERCOCET/ROXICET) 5-325 MG tablet Take 1 tablet by mouth every 4 (four) hours as needed for severe pain. 06/29/16   Jacalyn Lefevre, MD  predniSONE (STERAPRED UNI-PAK 21 TAB) 10 MG (21) TBPK tablet Take 6 tabs by mouth daily  for 2 days, then 5 tabs for 2 days, then 4 tabs for 2 days, then 3 tabs for 2 days, 2 tabs for 2 days, then 1 tab by mouth daily for 2 days 06/29/16   Jacalyn Lefevre, MD  traMADol (ULTRAM) 50 MG tablet Take 1 tablet (50 mg total) by mouth every 6 (six) hours as needed. 05/01/16   Layla Maw Ward, DO    Family History No family history on file.  Social History Social History  Substance Use Topics  .  Smoking status: Current Every Day Smoker    Types: Cigarettes  . Smokeless tobacco: Never Used  . Alcohol use No     Allergies   Lortab [hydrocodone-acetaminophen] and Wheat bran   Review of Systems Review of Systems  Musculoskeletal: Positive for back pain.  All other systems reviewed and are negative.    Physical Exam Updated Vital Signs BP 146/84 (BP Location: Left Arm)   Pulse 78   Temp 97.9 F (36.6 C) (Oral)   Resp 20   Ht 5\' 11"  (1.803 m)   Wt 290 lb (131.5 kg)   SpO2 100%   BMI 40.45 kg/m   Physical Exam  Constitutional: He is oriented to person, place, and time. He appears well-developed and well-nourished.  HENT:  Head: Normocephalic and atraumatic.  Right Ear: External ear normal.  Left Ear: External ear normal.  Nose: Nose normal.  Mouth/Throat: Oropharynx is clear and moist.  Eyes: Conjunctivae and EOM are normal. Pupils are equal, round, and reactive to light.  Neck: Normal range of motion. Neck supple.  Cardiovascular: Normal rate, regular rhythm, normal heart sounds and intact distal pulses.   Pulmonary/Chest: Effort normal and breath sounds normal.  Abdominal:  Soft. Bowel sounds are normal.  Musculoskeletal:       Arms: Neurological: He is alert and oriented to person, place, and time.  Skin: Skin is warm and dry.  Psychiatric: He has a normal mood and affect. His behavior is normal. Judgment and thought content normal.  Nursing note and vitals reviewed.    ED Treatments / Results  Labs (all labs ordered are listed, but only abnormal results are displayed) Labs Reviewed  URINALYSIS, ROUTINE W REFLEX MICROSCOPIC    EKG  EKG Interpretation None       Radiology No results found.  Procedures Procedures (including critical care time)  Medications Ordered in ED Medications  oxyCODONE-acetaminophen (PERCOCET/ROXICET) 5-325 MG per tablet 1 tablet (not administered)  morphine 4 MG/ML injection 4 mg (4 mg Intramuscular Given 06/29/16  1058)  diazepam (VALIUM) tablet 5 mg (5 mg Oral Given 06/29/16 1058)  ketorolac (TORADOL) 30 MG/ML injection 30 mg (30 mg Intramuscular Given 06/29/16 1058)  predniSONE (DELTASONE) tablet 60 mg (60 mg Oral Given 06/29/16 1125)     Initial Impression / Assessment and Plan / ED Course  I have reviewed the triage vital signs and the nursing notes.  Pertinent labs & imaging results that were available during my care of the patient were reviewed by me and considered in my medical decision making (see chart for details).     Pain has improved.  Pt is able to ambulate.  He has no trouble urinating.  He knows to return if he worsens.  Final Clinical Impressions(s) / ED Diagnoses   Final diagnoses:  Strain of lumbar region, initial encounter    New Prescriptions New Prescriptions   DIAZEPAM (VALIUM) 5 MG TABLET    Take 1 tablet (5 mg total) by mouth 2 (two) times daily.   OXYCODONE-ACETAMINOPHEN (PERCOCET/ROXICET) 5-325 MG TABLET    Take 1 tablet by mouth every 4 (four) hours as needed for severe pain.   PREDNISONE (STERAPRED UNI-PAK 21 TAB) 10 MG (21) TBPK TABLET    Take 6 tabs by mouth daily  for 2 days, then 5 tabs for 2 days, then 4 tabs for 2 days, then 3 tabs for 2 days, 2 tabs for 2 days, then 1 tab by mouth daily for 2 days     Jacalyn LefevreJulie Chanae Gemma, MD 06/29/16 1203

## 2016-07-02 ENCOUNTER — Ambulatory Visit (INDEPENDENT_AMBULATORY_CARE_PROVIDER_SITE_OTHER): Payer: Commercial Managed Care - HMO | Admitting: Family Medicine

## 2016-07-02 ENCOUNTER — Encounter: Payer: Self-pay | Admitting: Family Medicine

## 2016-07-02 DIAGNOSIS — M545 Low back pain, unspecified: Secondary | ICD-10-CM

## 2016-07-02 MED ORDER — DIAZEPAM 5 MG PO TABS: 5.0000 mg | ORAL_TABLET | Freq: Four times a day (QID) | ORAL | 0 refills | Status: DC | PRN

## 2016-07-02 MED ORDER — OXYCODONE-ACETAMINOPHEN 5-325 MG PO TABS: 1.0000 | ORAL_TABLET | Freq: Four times a day (QID) | ORAL | 0 refills | Status: DC | PRN

## 2016-07-02 NOTE — Patient Instructions (Addendum)
You have a severe lumbar strain, less likely a disc herniation. Both are treated similarly initially. Finish the prednisone you were prescribed. Day AFTER finishing prednisone start the ibuprofen 800mg  three times a day with food for pain and inflammation as needed. Oxycodone as needed for severe pain (no driving on this medicine). Valium as needed for muscle spasms (no driving on this medicine). Stay as active as possible. Physical therapy has been shown to be helpful as well but I don't think you would tolerate this at this time. Strengthening of low back muscles, abdominal musculature are key for long term pain relief. See handout for home exercises and stretches to start as pain improves. If not improving, will consider further imaging (MRI). Call me Friday or Monday to let me know how you're doing.

## 2016-07-03 DIAGNOSIS — M545 Low back pain, unspecified: Secondary | ICD-10-CM | POA: Insufficient documentation

## 2016-07-03 NOTE — Progress Notes (Signed)
PCP: No PCP Per Patient  Subjective:   HPI: Patient is a 41 y.o. male here for low back injury.  Patient reports he woke up on 1/26 with a twinge in right side of low back. Decided to go to work - got out of his chair and felt whole right side 'lock up' on him. Does a lot of lifting at work. Prior issues with back - known transverse process fracture remotely at L4 - reports he had extensive PT after this. Problems intermittently since then. Pain is now 7/10, sharp, on right side. Worse with standing, sitting, and sleeping. Tried 6 days of prednisone, ibuprofen, valium, percocet. No bowel/bladder dysfunction. No numbness or tingling. No radiation into legs.  Past Medical History:  Diagnosis Date  . Bursitis    right  . Hypertension   . Lumbar verterbral fracture, traumatic (HCC)     Current Outpatient Prescriptions on File Prior to Visit  Medication Sig Dispense Refill  . amoxicillin (AMOXIL) 500 MG capsule Take 1 capsule (500 mg total) by mouth 3 (three) times daily. 30 capsule 0  . ibuprofen (ADVIL,MOTRIN) 800 MG tablet Take 1 tablet (800 mg total) by mouth every 8 (eight) hours as needed for mild pain. 30 tablet 0  . predniSONE (STERAPRED UNI-PAK 21 TAB) 10 MG (21) TBPK tablet Take 6 tabs by mouth daily  for 2 days, then 5 tabs for 2 days, then 4 tabs for 2 days, then 3 tabs for 2 days, 2 tabs for 2 days, then 1 tab by mouth daily for 2 days 42 tablet 0   No current facility-administered medications on file prior to visit.     Past Surgical History:  Procedure Laterality Date  . KNEE SURGERY      Allergies  Allergen Reactions  . Lortab [Hydrocodone-Acetaminophen]   . Wheat Bran     Social History   Social History  . Marital status: Married    Spouse name: N/A  . Number of children: N/A  . Years of education: N/A   Occupational History  . Not on file.   Social History Main Topics  . Smoking status: Current Every Day Smoker    Types: Cigarettes  .  Smokeless tobacco: Never Used  . Alcohol use No  . Drug use: No  . Sexual activity: Not on file   Other Topics Concern  . Not on file   Social History Narrative  . No narrative on file    No family history on file.  BP (!) 147/94   Pulse 73   Ht 5\' 11"  (1.803 m)   Wt 290 lb (131.5 kg)   BMI 40.45 kg/m   Review of Systems: See HPI above.     Objective:  Physical Exam:  Gen: NAD, comfortable in exam room  Back: No gross deformity, scoliosis. TTP right > left paraspinal lumbar regions.  No midline or bony TTP. ROM limited to 40 degrees flexion, 0 extension. Strength LEs 5/5 all muscle groups.   2+ MSRs in patellar and achilles tendons, equal bilaterally. Negative SLRs. Sensation intact to light touch bilaterally. Negative logroll bilateral hips.  Assessment & Plan:  1. Low back pain - consistent with severe lumbar strain, less likely disc herniation.  Finish prednisone.  Take ibuprofen 800 tid after finishing this.  Valium and percocet as needed.  Shown home exercises to do daily.  Call us Friday or Monday to let us know how he's doing - consider physical therapy if improving, MRI if not improving.

## 2016-07-03 NOTE — Assessment & Plan Note (Signed)
consistent with severe lumbar strain, less likely disc herniation.  Finish prednisone.  Take ibuprofen 800 tid after finishing this.  Valium and percocet as needed.  Shown home exercises to do daily.  Call us Friday or Monday to let us know how he's doing - consider physical therapy if improving, MRI if not improving.

## 2016-07-09 ENCOUNTER — Ambulatory Visit (INDEPENDENT_AMBULATORY_CARE_PROVIDER_SITE_OTHER): Payer: Commercial Managed Care - HMO | Admitting: Family Medicine

## 2016-07-09 ENCOUNTER — Encounter: Payer: Self-pay | Admitting: Family Medicine

## 2016-07-09 ENCOUNTER — Ambulatory Visit (HOSPITAL_BASED_OUTPATIENT_CLINIC_OR_DEPARTMENT_OTHER)
Admission: RE | Admit: 2016-07-09 | Discharge: 2016-07-09 | Disposition: A | Discharge status: Home / Self Care | Payer: Commercial Managed Care - HMO | Source: Ambulatory Visit | Attending: Family Medicine | Admitting: Family Medicine

## 2016-07-09 VITALS — BP 137/89 | HR 86 | Ht 71.0 in | Wt 295.0 lb

## 2016-07-09 DIAGNOSIS — M545 Low back pain, unspecified: Secondary | ICD-10-CM

## 2016-07-09 DIAGNOSIS — I10 Essential (primary) hypertension: Secondary | ICD-10-CM | POA: Diagnosis not present

## 2016-07-09 DIAGNOSIS — M47814 Spondylosis without myelopathy or radiculopathy, thoracic region: Secondary | ICD-10-CM | POA: Diagnosis not present

## 2016-07-09 DIAGNOSIS — M48061 Spinal stenosis, lumbar region without neurogenic claudication: Secondary | ICD-10-CM | POA: Insufficient documentation

## 2016-07-09 NOTE — Patient Instructions (Signed)
Finish out the prednisone. Take the valium, oxycodone as needed. We will go ahead with an MRI of your lumbar spine at this point to assess for disc herniation, occult compression fracture.

## 2016-07-10 ENCOUNTER — Telehealth: Payer: Self-pay | Admitting: *Deleted

## 2016-07-10 ENCOUNTER — Encounter: Payer: Self-pay | Admitting: Family Medicine

## 2016-07-10 NOTE — Progress Notes (Addendum)
PCP: No PCP Per Patient  Subjective:   HPI: Patient is a 41 y.o. male here for low back injury.  1/29: Patient reports he woke up on 1/26 with a twinge in right side of low back. Decided to go to work - got out of his chair and felt whole right side 'lock up' on him. Does a lot of lifting at work. Prior issues with back - known transverse process fracture remotely at L4 - reports he had extensive PT after this. Problems intermittently since then. Pain is now 7/10, sharp, on right side. Worse with standing, sitting, and sleeping. Tried 6 days of prednisone, ibuprofen, valium, percocet. No bowel/bladder dysfunction. No numbness or tingling. No radiation into legs.  2/5: Patient returns with continued pain in low back. Pain level is currently 5/10, sharp. Worse on right side and central part of low back. Cannot sit, stand a long time, or bend due to pain. Taking prednisone with valium and percocet as needed. Back feels stiff. Doing home exercises. No radiation into extremities. No numbness or tingling. No bowel/bladder dysfunction.  Past Medical History:  Diagnosis Date  . Bursitis    right  . Hypertension   . Lumbar verterbral fracture, traumatic (HCC)     Current Outpatient Prescriptions on File Prior to Visit  Medication Sig Dispense Refill  . amoxicillin (AMOXIL) 500 MG capsule Take 1 capsule (500 mg total) by mouth 3 (three) times daily. 30 capsule 0  . diazepam (VALIUM) 5 MG tablet Take 1 tablet (5 mg total) by mouth every 6 (six) hours as needed for muscle spasms. 20 tablet 0  . ibuprofen (ADVIL,MOTRIN) 800 MG tablet Take 1 tablet (800 mg total) by mouth every 8 (eight) hours as needed for mild pain. 30 tablet 0  . oxyCODONE-acetaminophen (PERCOCET/ROXICET) 5-325 MG tablet Take 1 tablet by mouth every 6 (six) hours as needed for severe pain. 20 tablet 0  . predniSONE (STERAPRED UNI-PAK 21 TAB) 10 MG (21) TBPK tablet Take 6 tabs by mouth daily  for 2 days, then 5 tabs  for 2 days, then 4 tabs for 2 days, then 3 tabs for 2 days, 2 tabs for 2 days, then 1 tab by mouth daily for 2 days 42 tablet 0   No current facility-administered medications on file prior to visit.     Past Surgical History:  Procedure Laterality Date  . KNEE SURGERY      Allergies  Allergen Reactions  . Lortab [Hydrocodone-Acetaminophen]   . Wheat Bran     Social History   Social History  . Marital status: Married    Spouse name: N/A  . Number of children: N/A  . Years of education: N/A   Occupational History  . Not on file.   Social History Main Topics  . Smoking status: Current Every Day Smoker    Types: Cigarettes  . Smokeless tobacco: Never Used  . Alcohol use No  . Drug use: No  . Sexual activity: Not on file   Other Topics Concern  . Not on file   Social History Narrative  . No narrative on file    No family history on file.  BP 137/89   Pulse 86   Ht 5\' 11"  (1.803 m)   Wt 295 lb (133.8 kg)   BMI 41.14 kg/m   Review of Systems: See HPI above.     Objective:  Physical Exam:  Gen: NAD, comfortable in exam room  Back: No gross deformity, scoliosis. TTP right >  left paraspinal lumbar regions.  No midline or bony TTP. ROM limited to 50 degrees flexion, 10 extension. Strength LEs 5/5 all muscle groups except 4+/5 right knee extension.   2+ MSRs in patellar and achilles tendons, equal bilaterally. Mild positive SLR on right. Sensation intact to light touch bilaterally. Negative logroll bilateral hips.  Assessment & Plan:  1. Low back pain - Independently reviewed radiographs and no acute abnormalities to account for his pain.  Not improving as I would expect over past week with home exercises, prednisone, valium, percocet.  Has some weakness of knee extension on right now as well.  Will go ahead with MRI to assess for disc herniation.  Finish out prednisone, continue home exercises and valium in meantime.    Addendum:  MRI reviewed and  discussed with patient.  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  He is improving.  He would like to continue with home exercise program, wait on physical therapy.  Sent in flexeril for spasms.  Light duty note provided for 2 weeks then return to full duty.  F/u in 1 month to 6 weeks.

## 2016-07-10 NOTE — Telephone Encounter (Signed)
Spoke to patient and he would like to talk with you. Has some questions.

## 2016-07-10 NOTE — Assessment & Plan Note (Signed)
Independently reviewed radiographs and no acute abnormalities to account for his pain.  Not improving as I would expect over past week with home exercises, prednisone, valium, percocet.  Has some weakness of knee extension on right now as well.  Will go ahead with MRI to assess for disc herniation.  Finish out prednisone, continue home exercises and valium in meantime.

## 2016-07-11 ENCOUNTER — Telehealth: Payer: Self-pay | Admitting: Family Medicine

## 2016-07-11 ENCOUNTER — Encounter: Payer: Self-pay | Admitting: Family Medicine

## 2016-07-11 NOTE — Telephone Encounter (Signed)
Responded to patient's email regarding his question.

## 2016-07-11 NOTE — Addendum Note (Signed)
Addended by: Kathi SimpersWISE, Klaire Court F on: 07/11/2016 03:07 PM   Modules accepted: Orders

## 2016-07-12 NOTE — Telephone Encounter (Signed)
Ordered faxed and appointment scheduled.

## 2016-07-13 ENCOUNTER — Encounter: Payer: Self-pay | Admitting: Family Medicine

## 2016-07-14 ENCOUNTER — Ambulatory Visit (HOSPITAL_BASED_OUTPATIENT_CLINIC_OR_DEPARTMENT_OTHER): Payer: Commercial Managed Care - HMO

## 2016-07-16 ENCOUNTER — Telehealth: Payer: Self-pay | Admitting: Family Medicine

## 2016-07-16 MED ORDER — CYCLOBENZAPRINE HCL 10 MG PO TABS: 10.0000 mg | ORAL_TABLET | Freq: Every evening | ORAL | 1 refills | Status: DC | PRN

## 2016-07-16 NOTE — Addendum Note (Signed)
Addended by: Lenda KelpHUDNALL, Marylen Zuk R on: 07/16/2016 05:05 PM   Modules accepted: Orders

## 2016-07-16 NOTE — Telephone Encounter (Signed)
Spoke with patient- see addendum to office note.

## 2016-07-20 ENCOUNTER — Encounter: Payer: Self-pay | Admitting: Family Medicine

## 2016-08-05 ENCOUNTER — Encounter: Payer: Self-pay | Admitting: Family Medicine

## 2016-08-06 ENCOUNTER — Encounter: Payer: Self-pay | Admitting: Family Medicine

## 2016-08-10 NOTE — Addendum Note (Signed)
Addended by: Kathi SimpersWISE, Ronasia Isola F on: 08/10/2016 10:42 AM   Modules accepted: Orders

## 2016-08-15 ENCOUNTER — Ambulatory Visit (INDEPENDENT_AMBULATORY_CARE_PROVIDER_SITE_OTHER): Payer: Commercial Managed Care - HMO | Admitting: Family Medicine

## 2016-08-15 ENCOUNTER — Encounter: Payer: Self-pay | Admitting: Family Medicine

## 2016-08-15 DIAGNOSIS — M545 Low back pain, unspecified: Secondary | ICD-10-CM

## 2016-08-15 NOTE — Patient Instructions (Signed)
Start physical therapy and do home exercises on days you don't go to therapy. Tylenol 500mg  1-2 tabs three times a day as needed for pain (don't take this if you're taking the oxycodone as this has tylenol in it). Flexeril three times a day as needed for spasms as you have been. Ibuprofen 600mg  three times a day with food OR aleve 2 tabs twice a day with food for pain and inflammation as needed. Light duty while you're going to therapy. Follow up with me in 6 weeks for reevaluation.

## 2016-08-16 NOTE — Assessment & Plan Note (Signed)
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  Continue with flexeril as needed.  Tylenol, ibuprofen or aleve.  Start physical therapy.  Placed on light duty while he's doing therapy.  f/u in 6 weeks.

## 2016-08-16 NOTE — Progress Notes (Signed)
PCP: No PCP Per Patient  Subjective:   HPI: Patient is a 41 y.o. male here for low back injury.  1/29: Patient reports he woke up on 1/26 with a twinge in right side of low back. Decided to go to work - got out of his chair and felt whole right side 'lock up' on him. Does a lot of lifting at work. Prior issues with back - known transverse process fracture remotely at L4 - reports he had extensive PT after this. Problems intermittently since then. Pain is now 7/10, sharp, on right side. Worse with standing, sitting, and sleeping. Tried 6 days of prednisone, ibuprofen, valium, percocet. No bowel/bladder dysfunction. No numbness or tingling. No radiation into legs.  2/5: Patient returns with continued pain in low back. Pain level is currently 5/10, sharp. Worse on right side and central part of low back. Cannot sit, stand a long time, or bend due to pain. Taking prednisone with valium and percocet as needed. Back feels stiff. Doing home exercises. No radiation into extremities. No numbness or tingling. No bowel/bladder dysfunction.  3/14: Patient reports he's improving but slowly. Pain level is now 4/10, sharp and on the right side Back feels tight all the time. Pain worse about 4.5-5 hours into work. Better in the morning. Taking flexeril which helps. No numbness/tingling. No radiation into extremities. No bowel/bladder dysfunction.  Past Medical History:  Diagnosis Date  . Bursitis    right  . Hypertension   . Lumbar verterbral fracture, traumatic [facility](HCC)     Current Outpatient Prescriptions on File Prior to Visit  Medication Sig Dispense Refill  . cyclobenzaprine (FLEXERIL) 10 MG tablet Take 1 tablet (10 mg total) by mouth at bedtime as needed for muscle spasms. 60 tablet 1  . ibuprofen (ADVIL,MOTRIN) 800 MG tablet Take 1 tablet (800 mg total) by mouth every 8 (eight) hours as needed for mild pain. 30 tablet 0   No current facility-administered medications on  file prior to visit.     Past Surgical History:  Procedure Laterality Date  . KNEE SURGERY      Allergies  Allergen Reactions  . Lortab [Hydrocodone-Acetaminophen]   . Wheat Bran     Social History   Social History  . Marital status: Married    Spouse name: N/A  . Number of children: N/A  . Years of education: N/A   Occupational History  . Not on file.   Social History Main Topics  . Smoking status: Current Every Day Smoker    Types: Cigarettes  . Smokeless tobacco: Never Used  . Alcohol use No  . Drug use: No  . Sexual activity: Not on file   Other Topics Concern  . Not on file   Social History Narrative  . No narrative on file    No family history on file.  BP (!) 142/94   Pulse 99   Ht 5\' 11"  (1.803 m)   Wt 295 lb (133.8 kg)   BMI 41.14 kg/m   Review of Systems: See HPI above.     Objective:  Physical Exam:  Gen: NAD, comfortable in exam room  Back: No gross deformity, scoliosis. TTP right paraspinal lumbar region.  No midline or bony TTP. FROM with pain on extension > flexion. Strength LEs 5/5 all muscle groups.   2+ MSRs in patellar and achilles tendons, equal bilaterally. Sensation intact to light touch bilaterally. Negative logroll bilateral hips.  Assessment & Plan:  1. Low back pain - MRI with facet  arthritis at L5-S1 and most of pain related to muscle strain/spasms.  Continue with flexeril as needed.  Tylenol, ibuprofen or aleve.  Start physical therapy.  Placed on light duty while he's doing therapy.  f/u in 6 weeks.

## 2016-08-22 ENCOUNTER — Encounter: Payer: Self-pay | Admitting: Family Medicine

## 2016-09-11 ENCOUNTER — Encounter: Payer: Self-pay | Admitting: Family Medicine

## 2016-09-11 MED ORDER — CYCLOBENZAPRINE HCL 10 MG PO TABS: 10.0000 mg | ORAL_TABLET | Freq: Three times a day (TID) | ORAL | 1 refills | Status: DC | PRN

## 2016-09-19 ENCOUNTER — Ambulatory Visit: Payer: Self-pay | Admitting: Family Medicine

## 2016-10-09 ENCOUNTER — Emergency Department (HOSPITAL_BASED_OUTPATIENT_CLINIC_OR_DEPARTMENT_OTHER): Payer: Worker's Compensation

## 2016-10-09 ENCOUNTER — Encounter (HOSPITAL_BASED_OUTPATIENT_CLINIC_OR_DEPARTMENT_OTHER): Payer: Self-pay | Admitting: Emergency Medicine

## 2016-10-09 ENCOUNTER — Emergency Department (HOSPITAL_BASED_OUTPATIENT_CLINIC_OR_DEPARTMENT_OTHER)
Admission: EM | Admit: 2016-10-09 | Discharge: 2016-10-09 | Disposition: A | Discharge status: Home / Self Care | Payer: Worker's Compensation | Attending: Emergency Medicine | Admitting: Emergency Medicine

## 2016-10-09 DIAGNOSIS — M5442 Lumbago with sciatica, left side: Secondary | ICD-10-CM | POA: Insufficient documentation

## 2016-10-09 DIAGNOSIS — W2209XA Striking against other stationary object, initial encounter: Secondary | ICD-10-CM | POA: Insufficient documentation

## 2016-10-09 DIAGNOSIS — R51 Headache: Secondary | ICD-10-CM | POA: Insufficient documentation

## 2016-10-09 DIAGNOSIS — G8929 Other chronic pain: Secondary | ICD-10-CM

## 2016-10-09 DIAGNOSIS — R519 Headache, unspecified: Secondary | ICD-10-CM

## 2016-10-09 DIAGNOSIS — M25511 Pain in right shoulder: Secondary | ICD-10-CM | POA: Diagnosis not present

## 2016-10-09 DIAGNOSIS — Y929 Unspecified place or not applicable: Secondary | ICD-10-CM | POA: Insufficient documentation

## 2016-10-09 DIAGNOSIS — Y999 Unspecified external cause status: Secondary | ICD-10-CM | POA: Insufficient documentation

## 2016-10-09 DIAGNOSIS — F17210 Nicotine dependence, cigarettes, uncomplicated: Secondary | ICD-10-CM | POA: Insufficient documentation

## 2016-10-09 DIAGNOSIS — S4991XA Unspecified injury of right shoulder and upper arm, initial encounter: Secondary | ICD-10-CM | POA: Diagnosis present

## 2016-10-09 DIAGNOSIS — M5441 Lumbago with sciatica, right side: Secondary | ICD-10-CM | POA: Diagnosis not present

## 2016-10-09 DIAGNOSIS — Y939 Activity, unspecified: Secondary | ICD-10-CM | POA: Diagnosis not present

## 2016-10-09 DIAGNOSIS — I10 Essential (primary) hypertension: Secondary | ICD-10-CM | POA: Insufficient documentation

## 2016-10-09 MED ORDER — LIDOCAINE 5 % EX PTCH: 1.0000 | MEDICATED_PATCH | CUTANEOUS | 0 refills | Status: AC

## 2016-10-09 MED ORDER — KETOROLAC TROMETHAMINE 30 MG/ML IJ SOLN
30.0000 mg | Freq: Once | INTRAMUSCULAR | Status: AC
  Administered 2016-10-09: 30 mg via INTRAMUSCULAR
  Filled 2016-10-09: qty 1

## 2016-10-09 NOTE — ED Notes (Signed)
ED Provider at bedside. 

## 2016-10-09 NOTE — Discharge Instructions (Signed)
All of your imaging is normal. Please wear the sling for comfort. Continue taking your muscle relaxer. May use over the counter motrin and tylenol. Use the lidocaine patch to affected area. Ice pack to shoulder and heat to low back. Follow up with ortho tomorrow return to the ED if symptoms worsen.
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
## 2016-10-09 NOTE — ED Notes (Signed)
Patient transported to MRI 
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
## 2016-10-10 ENCOUNTER — Ambulatory Visit (INDEPENDENT_AMBULATORY_CARE_PROVIDER_SITE_OTHER): Payer: Commercial Managed Care - HMO | Admitting: Family Medicine

## 2016-10-10 DIAGNOSIS — M545 Low back pain, unspecified: Secondary | ICD-10-CM

## 2016-10-10 MED ORDER — PREDNISONE 10 MG PO TABS: ORAL_TABLET | ORAL | 0 refills | Status: DC

## 2016-10-10 MED ORDER — OXYCODONE-ACETAMINOPHEN 5-325 MG PO TABS: 1.0000 | ORAL_TABLET | ORAL | 0 refills | Status: DC | PRN

## 2016-10-10 NOTE — Patient Instructions (Signed)
You have a severe lumbar strain and lumbar contusion. Take prednisone dose pack until completed for 6 days. Day AFTER finishing prednisone start the ibuprofen 800mg  three times a day with food for pain and inflammation OR aleve 2 tabs twice a day with food as needed. Oxycodone as needed for severe pain (no driving on this medicine). Flexeril as needed for spasms. Stay as active as possible. Physical therapy has been shown to be helpful as well but I don't think you would tolerate this at this time - can start home exercises and stretches though. Strengthening of low back muscles, abdominal musculature are key for long term pain relief. Follow through with workers comp about this and your shoulder - see me back when they have approved this (or they may have you see another physician).

## 2016-10-16 NOTE — Assessment & Plan Note (Signed)
New injury consistent with lumbar contusion and strain.  Independently reviewed radiographs and no evidence new bony abnormalities.  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  Start with prednisone, transition to aleve or ibuprofen.  Oxycodone and flexeril as needed.  Start home exercises.  Follow up about his shoulder when goes through workers comp and will reevaluate back then.
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
## 2017-08-26 ENCOUNTER — Encounter (HOSPITAL_BASED_OUTPATIENT_CLINIC_OR_DEPARTMENT_OTHER): Payer: Self-pay | Admitting: *Deleted

## 2017-08-26 ENCOUNTER — Other Ambulatory Visit: Payer: Self-pay

## 2017-08-26 DIAGNOSIS — F17210 Nicotine dependence, cigarettes, uncomplicated: Secondary | ICD-10-CM | POA: Insufficient documentation

## 2017-08-26 DIAGNOSIS — S3992XA Unspecified injury of lower back, initial encounter: Secondary | ICD-10-CM | POA: Diagnosis present

## 2017-08-26 DIAGNOSIS — Y9389 Activity, other specified: Secondary | ICD-10-CM | POA: Diagnosis not present

## 2017-08-26 DIAGNOSIS — Z79899 Other long term (current) drug therapy: Secondary | ICD-10-CM | POA: Diagnosis not present

## 2017-08-26 DIAGNOSIS — S39012A Strain of muscle, fascia and tendon of lower back, initial encounter: Secondary | ICD-10-CM | POA: Diagnosis not present

## 2017-08-26 DIAGNOSIS — I10 Essential (primary) hypertension: Secondary | ICD-10-CM | POA: Insufficient documentation

## 2017-08-26 DIAGNOSIS — Y92410 Unspecified street and highway as the place of occurrence of the external cause: Secondary | ICD-10-CM | POA: Diagnosis not present

## 2017-08-26 DIAGNOSIS — Y999 Unspecified external cause status: Secondary | ICD-10-CM | POA: Insufficient documentation
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
## 2017-08-27 ENCOUNTER — Emergency Department (HOSPITAL_BASED_OUTPATIENT_CLINIC_OR_DEPARTMENT_OTHER)
Admission: EM | Admit: 2017-08-27 | Discharge: 2017-08-27 | Disposition: A | Discharge status: Home / Self Care | Payer: No Typology Code available for payment source | Attending: Emergency Medicine | Admitting: Emergency Medicine

## 2017-08-27 ENCOUNTER — Emergency Department (HOSPITAL_BASED_OUTPATIENT_CLINIC_OR_DEPARTMENT_OTHER): Payer: No Typology Code available for payment source

## 2017-08-27 DIAGNOSIS — S39012A Strain of muscle, fascia and tendon of lower back, initial encounter: Secondary | ICD-10-CM

## 2017-08-27 MED ORDER — IBUPROFEN 800 MG PO TABS
800.0000 mg | ORAL_TABLET | Freq: Once | ORAL | Status: AC
  Administered 2017-08-27: 800 mg via ORAL
  Filled 2017-08-27: qty 1

## 2017-08-27 MED ORDER — OXYCODONE-ACETAMINOPHEN 5-325 MG PO TABS
1.0000 | ORAL_TABLET | Freq: Once | ORAL | Status: AC
  Administered 2017-08-27: 1 via ORAL
  Filled 2017-08-27: qty 1

## 2017-08-27 MED ORDER — NAPROXEN 500 MG PO TABS: 500.0000 mg | ORAL_TABLET | Freq: Two times a day (BID) | ORAL | 0 refills | Status: DC

## 2017-08-27 MED ORDER — METHOCARBAMOL 500 MG PO TABS: 500.0000 mg | ORAL_TABLET | Freq: Two times a day (BID) | ORAL | 0 refills | Status: DC
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
## 2017-08-29 ENCOUNTER — Ambulatory Visit (INDEPENDENT_AMBULATORY_CARE_PROVIDER_SITE_OTHER): Payer: Self-pay | Admitting: Family Medicine

## 2017-08-29 ENCOUNTER — Encounter: Payer: Self-pay | Admitting: Family Medicine

## 2017-08-29 DIAGNOSIS — M542 Cervicalgia: Secondary | ICD-10-CM

## 2017-08-29 DIAGNOSIS — M545 Low back pain, unspecified: Secondary | ICD-10-CM

## 2017-08-29 MED ORDER — CYCLOBENZAPRINE HCL 10 MG PO TABS: 10.0000 mg | ORAL_TABLET | Freq: Three times a day (TID) | ORAL | 1 refills | Status: AC | PRN

## 2017-08-29 MED ORDER — OXYCODONE-ACETAMINOPHEN 5-325 MG PO TABS: 1.0000 | ORAL_TABLET | ORAL | 0 refills | Status: AC | PRN

## 2017-08-29 MED ORDER — OXYCODONE-ACETAMINOPHEN 5-325 MG PO TABS: 1.0000 | ORAL_TABLET | ORAL | 0 refills | Status: DC | PRN

## 2017-08-29 MED ORDER — PREDNISONE 10 MG PO TABS: ORAL_TABLET | ORAL | 0 refills | Status: AC

## 2017-08-29 NOTE — Patient Instructions (Signed)
You have cervical and lumbar strains. Prednisone dose pack x 6 days. Day AFTER finishing prednisone you can take Aleve or ibuprofen with food for pain and inflammation. Percocet as needed for severe pain (no driving on this medicine). Flexeril as needed for muscle spasms (no driving on this medicine if it makes you sleepy). Stay as active as possible. Do home exercises and stretches as directed - hold each for 20-30 seconds and do each one three times. Consider massage, chiropractor, physical therapy, and/or acupuncture. Physical therapy has been shown to be helpful while the others have mixed results. Strengthening of low back muscles, abdominal musculature are key for long term pain relief. If not improving, will consider further imaging (MRI). Follow up with me in 1 month but call me over the next 1-2 weeks if not improving as expected.

## 2017-09-02 ENCOUNTER — Encounter: Payer: Self-pay | Admitting: Family Medicine

## 2017-09-02 DIAGNOSIS — M542 Cervicalgia: Secondary | ICD-10-CM | POA: Insufficient documentation
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
## 2017-09-02 NOTE — Assessment & Plan Note (Signed)
Discussed options - will start prednisone dose pack with Flexeril and oxycodone as needed.  He will do home exercises and stretches to do daily.  Follow-up in 1 month call the next 1-2 weeks if not improving will consider an MRI.

## 2017-09-02 NOTE — Assessment & Plan Note (Signed)
consistent with cervical strain.  Discussed options - will start prednisone dose pack with Flexeril and oxycodone as needed.  He will do home exercises and stretches to do daily.  Follow-up in 1 month call the next 1-2 weeks if not improving will consider an MRI.

## 2017-10-01 ENCOUNTER — Ambulatory Visit: Payer: Self-pay | Admitting: Family Medicine
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
